# Patient Record
Sex: Female | Born: 1990 | Race: White | Hispanic: No | Marital: Single | Smoking: Current every day smoker
Health system: Southern US, Community
[De-identification: ages and names within clinical notes are randomized; demographics above are authoritative.]

## PROBLEM LIST (undated history)

## (undated) HISTORY — PX: TONSILLECTOMY: SUR1361

## (undated) HISTORY — PX: HAND SURGERY: SHX662

---
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
## 2011-02-07 NOTE — Op Note (Signed)
NAMEMINDEL, FRISCIA                        ACCOUNT NO.:  [id number]   MEDICAL RECORD NO.:  [id number]                   PATIENT TYPE:  AMB   LOCATION:  DSC                                  FACILITY:  [facility]   PHYSICIAN:  Kinnie Scales. Annalee Genta, M.D.            DATE OF BIRTH:  Mar 28, 1991   DATE OF PROCEDURE:  03/18/2004  DATE OF DISCHARGE:                                 OPERATIVE REPORT   PREOPERATIVE DIAGNOSES:  1. Depressed nasal fracture.  2. Nasal trauma.   POSTOPERATIVE DIAGNOSES:  1. Depressed nasal fracture.  2. Nasal trauma.   OPERATION PERFORMED:  Closed reduction nasal fracture.   SURGEON:  Kinnie Scales. Annalee Genta, M.D.   ANESTHESIA:  General.   COMPLICATIONS:  None.   ESTIMATED BLOOD LOSS:  Minimal.   DISPOSITION:  Patient transferred from the operating room to the recovery  room in stable condition.   INDICATIONS FOR PROCEDURE:  The patient is a nearly 20 year old white female  who was referred for evaluation of a depressed nasal fracture suffered while  playing soft ball.  The patient had no significant prior injuries and no  nasal complaints.  Examination in the office one week after injury showed a  depressed left nasal fracture and right nasal dorsal deviation.  Nasal  septum was mildly deviated.  No evidence of septal hematoma.  Given the  patient's history and examination, I recommended closed reduction of the  nasal fracture under general anesthesia.  The risks, benefits, and possible  complications of the procedure were discussed in detail with the patient and  her grandmother, who understood and concurred with our plan for surgery  which was scheduled as above.   DESCRIPTION OF PROCEDURE:  The patient was brought to the operating room on  March 18, 2004 and placed in supine position on the operating table.  She was  injected with 1 mL of 1% lidocaine with 1:100,000 dilution of epinephrine  injected in the fracture site via a transnasal and transcutaneous  injection.  The patient's nose was then packed with Afrin soaked cottonoid pledgets  which were left in place for approximately 10 minutes to allow for  vasoconstriction and hemostasis.  After allowing adequate time for  vasoconstriction, the patient was prepped and draped in sterile fashion.  The surgical procedure  was begun with nasal evaluation.  She had a  depressed left nasal fracture.  Using a Goldman elevator within the nasal  cavity and digital pressure, the nasal fracture was reduced and the nasal  dorsum was brought to the midline.  An external nasal splint was then placed  which consisted of benzoin  solution followed by 1/4 inch paper tape and an Aquaplast thermal plastic  nasal dorsal splint.  The patient was then awakened from her anesthetic.  She was extubated and was transferred from the operating room to the  recovery room in stable condition.  There were no complications or blood  loss.                                               Kinnie Scales. Annalee Genta, M.D.    DLS/MEDQ  D:  04/54/0981  T:  03/18/2004  Job:  [id number]

## 2011-12-14 ENCOUNTER — Encounter (HOSPITAL_COMMUNITY): Payer: Self-pay | Admitting: *Deleted

## 2011-12-14 ENCOUNTER — Other Ambulatory Visit: Payer: Self-pay

## 2011-12-14 ENCOUNTER — Emergency Department (HOSPITAL_COMMUNITY)
Admission: EM | Admit: 2011-12-14 | Discharge: 2011-12-15 | Disposition: A | Discharge status: Home / Self Care | Payer: Self-pay | Attending: Emergency Medicine | Admitting: Emergency Medicine

## 2011-12-14 DIAGNOSIS — T450X4A Poisoning by antiallergic and antiemetic drugs, undetermined, initial encounter: Secondary | ICD-10-CM | POA: Insufficient documentation

## 2011-12-14 DIAGNOSIS — F3289 Other specified depressive episodes: Secondary | ICD-10-CM | POA: Insufficient documentation

## 2011-12-14 DIAGNOSIS — T50992A Poisoning by other drugs, medicaments and biological substances, intentional self-harm, initial encounter: Secondary | ICD-10-CM | POA: Insufficient documentation

## 2011-12-14 DIAGNOSIS — F329 Major depressive disorder, single episode, unspecified: Secondary | ICD-10-CM | POA: Insufficient documentation

## 2011-12-14 DIAGNOSIS — T50902A Poisoning by unspecified drugs, medicaments and biological substances, intentional self-harm, initial encounter: Secondary | ICD-10-CM

## 2011-12-14 LAB — CBC
Hemoglobin: 12.7 g/dL (ref 12.0–15.0)
MCH: 31.4 pg (ref 26.0–34.0)
MCV: 92.6 fL (ref 78.0–100.0)
RDW: 13 % (ref 11.5–15.5)
WBC: 9.6 10*3/uL (ref 4.0–10.5)

## 2011-12-14 LAB — POCT I-STAT, CHEM 8
BUN: 9 mg/dL (ref 6–23)
Chloride: 105 mEq/L (ref 96–112)
Potassium: 3.5 mEq/L (ref 3.5–5.1)
Sodium: 143 mEq/L (ref 135–145)

## 2011-12-14 LAB — DIFFERENTIAL
Basophils Absolute: 0 10*3/uL (ref 0.0–0.1)
Lymphocytes Relative: 25 % (ref 12–46)
Monocytes Absolute: 0.5 10*3/uL (ref 0.1–1.0)

## 2011-12-14 LAB — ACETAMINOPHEN LEVEL: Acetaminophen (Tylenol), Serum: <15 ug/mL (ref 10–30)

## 2011-12-14 LAB — ETHANOL: Alcohol, Ethyl (B): <11 mg/dL (ref 0–11)

## 2011-12-14 LAB — RAPID URINE DRUG SCREEN, HOSP PERFORMED
Barbiturates: NOT DETECTED
Cocaine: NOT DETECTED

## 2011-12-14 NOTE — ED Notes (Signed)
Pt placed in blue scrubs per protocol. [facility] notified sitter will be required at bedside.

## 2011-12-14 NOTE — ED Notes (Signed)
Pt speaking with ACT team.  
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
## 2011-12-14 NOTE — ED Provider Notes (Signed)
I saw and evaluated the patient, reviewed the resident's note and I agree with the findings and plan. Pt with overdose of benadryl.  No symptoms after 3 hours of observation.  Will continue to observe, ACT consult  Rolan Bucco, MD 12/14/11 2325

## 2011-12-14 NOTE — ED Notes (Signed)
EKG completed at 2139. NO old ekg.

## 2011-12-14 NOTE — ED Notes (Signed)
Pt arrives via ambulance after taking app 41 tablets of 25 mg diphenhydramine at app 2015 this evening. Pt reports self inducing vomiting app 15 minutes after ingesting medication
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
## 2011-12-14 NOTE — ED Provider Notes (Signed)
History     CSN: [id number]  Arrival date & time 12/14/11  2130   First MD Initiated Contact with Patient 12/14/11 2137      Chief Complaint  Patient presents with  . Drug Overdose    pt took 41 tabs of 25mg  diphenhydramine at 2015 tonight    (Consider location/radiation/quality/duration/timing/severity/associated sxs/prior treatment) HPI Pt took forty-one benadryl 25mg  pills in OD attempt around 2015.  She states that 15 mins after she took them, she had remorse and did not want to die so she forced herself to throw up pills.  States she saw many full form pills in emesis.  Denies taking any other meds.  Has h/o depression and has been stressed recently.  Denies SI/HI and AVH at this time.  Denies any symptoms.  Her depression and suicidal thoughts earlier today are described as moderate, worse with stress.    History reviewed. No pertinent past medical history.  Past Surgical History  Procedure Date  . Tonsillectomy     History reviewed. No pertinent family history.  History  Substance Use Topics  . Smoking status: Former Smoker    Quit date: 11/30/2011  . Smokeless tobacco: Not on file  . Alcohol Use: Yes     socially    OB History    Grav Para Term Preterm Abortions TAB SAB Ect Mult Living                  Review of Systems  Constitutional: Negative for fatigue.  Respiratory: Negative for shortness of breath.   Cardiovascular: Negative for chest pain.  Gastrointestinal: Negative for nausea and abdominal pain.  Neurological: Negative for light-headedness.  Psychiatric/Behavioral: Positive for self-injury (attempted OD, remorse after, forced emesis). Negative for suicidal ideas.  All other systems reviewed and are negative.    Allergies  Niacin and related  Home Medications  No current outpatient prescriptions on file.  BP 100/52  Pulse 71  Temp(Src) 97 F (36.1 C) (Oral)  Resp 18  Ht 5\' 8"  (1.727 m)  Wt 170 lb (77.111 kg)  BMI 25.85 kg/m2  SpO2  98%  LMP 12/11/2011  Physical Exam  Nursing note and vitals reviewed. Constitutional: She appears well-developed and well-nourished.  HENT:  Head: Normocephalic and atraumatic.  Eyes: Right eye exhibits no discharge. Left eye exhibits no discharge.  Neck: Normal range of motion. Neck supple.  Cardiovascular: Normal rate, regular rhythm and normal heart sounds.   Pulmonary/Chest: Effort normal and breath sounds normal.  Abdominal: Soft. There is no tenderness.  Musculoskeletal: She exhibits no edema and no tenderness.  Neurological: She is alert. GCS eye subscore is 4. GCS verbal subscore is 5. GCS motor subscore is 6.  Skin: Skin is warm and dry.  Psychiatric: She has a normal mood and affect. Her behavior is normal.    ED Course  Procedures (including critical care time)  Labs Reviewed  URINE RAPID DRUG SCREEN (HOSP PERFORMED) - Abnormal; Notable for the following:    Benzodiazepines POSITIVE (*)    Tetrahydrocannabinol POSITIVE (*)    All other components within normal limits  SALICYLATE LEVEL - Abnormal; Notable for the following:    Salicylate Lvl <2.0 (*)    All other components within normal limits  CBC  DIFFERENTIAL  ETHANOL  ACETAMINOPHEN LEVEL  POCT I-STAT, CHEM 8  POCT PREGNANCY, URINE   No results found.   1. Suicidal behavior   2. Intentional drug overdose      EKG: sinus arrhythmia, no qrs  widening or qtc prolongation MDM  Pt is in nad, afvss, nontoxic appearing, exam and hx as above.  Psych panel ordered.  Exam nl and pt acting nl, no mydriasis, no slurred speech, no increased sleepiness.  ekg nl.    Recheck about one hour after pt arrived, still acting nl, no grogginess, no mydriasis.  12:40 AM Care transferred to Dr Manus Gunning, pt stable and still asx       Elijio Miles, MD 12/15/11 0040

## 2011-12-15 NOTE — ED Notes (Signed)
Pt speaking with psychiatrist via telepsych.

## 2011-12-15 NOTE — ED Notes (Signed)
EDP (dr guess) at bedside to discuss plan of care with pt.

## 2011-12-15 NOTE — ED Notes (Signed)
Faxed report given to dr rancour. MD at bedside to speak with pt

## 2011-12-15 NOTE — ED Notes (Signed)
Spoke with Almira Coaster from poison control regarding pt. Pt with sitter at bedside awaiting telepsych. Pt using phone per her request.

## 2011-12-15 NOTE — ED Notes (Signed)
Pt in room 27. Awaiting call back from psychiatrist for telepsych.

## 2011-12-15 NOTE — ED Provider Notes (Signed)
Tele psychiatry consult complete. Patient expresses regrets for her overdose attempt. She is not have any delusional thought process or cutting impairment. She is cleared for discharge. She denies any suicidal or homicidal ideation. He is awake and alert in no distress. She denies any pain complaints. She does not demonstrate any anticholinergic side effects  BP 100/52  Pulse 71  Temp(Src) 97 F (36.1 C) (Oral)  Resp 18  Ht 5\' 8"  (1.727 m)  Wt 170 lb (77.111 kg)  BMI 25.85 kg/m2  SpO2 98%  LMP 12/11/2011   Glynn Octave, MD 12/15/11 [phone]

## 2011-12-15 NOTE — ED Provider Notes (Signed)
I saw and evaluated the patient, reviewed the resident's note and I agree with the findings and plan.   Jacquees Gongora, MD 12/15/11 0052 

## 2011-12-15 NOTE — Discharge Instructions (Signed)
No-harm Safety Contract  A no-harm safety contract is a written or verbal agreement between you and a mental health professional to promote safety. It contains specific actions and promises you agree to. The agreement also includes instructions from the therapist or doctor. The instructions will help prevent you from harming yourself or harming others. Harm can be as mild as pinching yourself, but can increase in intensity to actions like burning or cutting yourself. The extreme level of self-harm would be committing suicide. No-harm safety contracts are also sometimes referred to as a Charity fundraiser, suicide Financial controller, no-harm agreements or decisions, or a Engineer, manufacturing systems.  REASONS FOR NO-HARM SAFETY CONTRACTS Safety contracts are just one part of an overall treatment plan to help keep you safe and free of harm. A safety contract may help to relieve anxiety, restore a sense of control, state clearly the alternatives to harm or suicide, and give you and your therapist or doctor a gauge for how you are doing in between visits. Many factors impact the decision to use a no-harm safety contract and its effectiveness. A proper overall treatment plan and evaluation and good patient understanding are the keys to good outcomes. CONTRACT ELEMENTS  A contract can range from simple to complex. They include all or some of the following:  Action statements. These are statements you agree to do or not do. Example: If I feel my life is becoming too difficult, I agree to do the following so there is no harm to myself or others:  Talk with family or friends.   Rid myself of all things that I could use to harm myself.   Do an activity I enjoy or have enjoyed in the recent past.  Coping strategies. These are ways to think and feel that decrease stress, such as:  Use of affirmations or positive statements about self.   Good self-care, including improved grooming, and healthy eating, and healthy  sleeping patterns.   Increase physical exercise.   Increase social involvement.   Focus on positive aspects of life.  Crisis management. This would include what to do if there was trouble following the contract or an urge to harm. This might include notifying family or your therapist of suicidal thoughts. Be open and honest about suicidal urges. To prevent a crisis, do the following:  List reasons to reach out for support.   Keep contact numbers and available hours handy.  Treatment goals. These are goals would include no suicidal thoughts, improved mood, and feelings of hopefulness. Listed responsibilities of different people involved in care. This could include family members. A family member may agree to remove firearms or other lethal weapons/substances from your ease of access. A timeline. A timeline can be in place from one therapy session to the next session. HOME CARE INSTRUCTIONS   Follow your no-harm safety contract.   Contact your therapist and/or doctor if you have any questions or concerns.  MAKE SURE YOU:   Understand these instructions.   Will watch your condition. Noticing any mood changes or suicidal urges.   Will get help right away if you are not doing well or get worse.  Document Released: 02/26/2010 Document Revised: 08/28/2011 Document Reviewed: 02/26/2010 [facility] Patient Information 2012 [place], [place].Overdose, Adult A person can overdose on alcohol, drugs or both by accident or on purpose. If it was on purpose, it is a serious matter. Professional help should be sought. If the overdose was an accident, certain steps should be taken to make sure that  it never happens again. ACCIDENTAL OVERDOSE Overdosing on prescription medications can be a result of:  Not understanding the instructions.   Misreading the label.   Forgetting that you took a dose and then taking another by mistake. This situation happens a lot.  To make sure this does not happen  again:  Clarify the correct dosage with your caregiver.   Place the correct dosage in a "pill-minder" container (labeled for each day and time of day).   Have someone dispense your medicine.  Please be sure to follow-up with your primary care doctor as directed. INTENTIONAL OVERDOSE If the overdose was on purpose, it is a serious situation. Taking more than the prescribed amount of medications (including taking someone else's prescription), abusing street drugs or drinking an amount of alcohol that requires medical treatment can show a variety of possible problems. These may indicate you:  Are depressed or suicidal.   Are abusing drugs, took too much or combined different drugs to experiment with the effects.   Mixed alcohol with drugs and did not realize the danger of doing so (this is drug abuse).   Are suffering addiction to drugs and/or alcohol (also known as chemical dependency).   Binge drink.  If you have not been referred to a mental health professional for help, it is important that you get help right away. Only a professional can determine which problems may exist and what the best course of treatment may be. It is your responsibility to follow-up with further evaluation or treatment as directed.  Alcohol is responsible for a large number of overdoses and unintended deaths among college-age young adults. Binge drinking is consuming 4-5 drinks in a short period of time. The amount of alcohol in standard servings of wine (5 oz.), beer (12 oz.) and distilled spirits (1.5 oz., 80 proof) is the same. Beer or wine can be just as dangerous to the binge drinker as "hard" liquor can be.  CONSEQUENCES OF BINGE DRINKING Alcohol poisoning is the most serious consequence of binge drinking. This is a severe and potentially fatal physical reaction to an alcohol overdose. When too much alcohol is consumed, the brain does not get enough oxygen. The lack of oxygen will eventually cause the brain to  shut down the voluntary functions that regulate breathing and heart rate. Symptoms of alcohol poisoning include:  Vomiting.   Passing out (unconsciousness).   Cold, clammy, pale or bluish skin.   Slow or irregular breathing.  WHAT SHOULD I DO NEXT? If you have a history of drug abuse or suffer chemical dependency (alcoholism, drug addiction or both), you might consider the following:  Talk with a qualified substance abuse counselor and consider entering a treatment program.   Go to a detox facility if necessary.   If you were attending self-help group meetings, consider returning to them and go often.   Explore other resources located near you (see sources listed below).  If you are unsure if you have a substance abuse problem, ask yourself the following questions:  Have you been told by friends or family that drugs/alcohol has become a problem?   Do you get into fights when drinking or using drugs?   Do you have blackouts (not remembering what you do while using)?   Do you lie about use or amounts of drugs or alcohol you consume?   Do you need chemicals to get you going?   Do you suffer in work or school performance because of drug or alcohol use?  Do you get sick from drug or alcohol use but continue to use anyway?   Do you need drugs or alcohol to relate to people or feel comfortable in social situations?   Do you use drugs or alcohol to forget problems?  If you answered "Yes" to any of the above questions, it means you show signs of chemical dependency and a professional evaluation is suggested. The longer the use of drugs and alcohol continues, the problems will become greater. SEEK IMMEDIATE MEDICAL CARE IF:   You feel like you might repeat your problematic behavior.   You need someone to talk to and feel that it should not wait.   You feel you are a danger to yourself or someone else.   You feel like you are having a new reaction to medications you are taking,  or you are getting worse after leaving a care center.   You have an overwhelming urge to drink or use drugs.  Addiction cannot be cured, but it can be treated successfully. Treatment centers are listed in the yellow pages under: Cocaine, Narcotics, and Alcoholics Anonymous. Most hospitals and clinics can refer you to a specialized care center. The [place] government maintains a toll-free number for obtaining treatment referrals: (934)258-7934 or 224-648-5575 (TDD) and maintains a website: http://findtreatment.RockToxic.pl. Other websites for additional information are: www.mentalhealth.RockToxic.pl. and GreatestFeeling.tn. In [place] treatment resources are listed in each [place] with listings available under [employer] for [employer] or similar titles. Document Released: 09/11/2003 Document Revised: 08/28/2011 Document Reviewed: 08/02/2008 [facility] Patient Information 2012 [place], [place].

## 2011-12-15 NOTE — ED Notes (Signed)
Per psychiatrist, pt is psychologically cleared for discharge. Pt notified there may be a delay prior to receiving discharge paperwork. Pt on telephone with friend at this time.

## 2013-02-27 ENCOUNTER — Encounter (HOSPITAL_COMMUNITY): Payer: Self-pay | Admitting: *Deleted

## 2013-02-27 ENCOUNTER — Emergency Department (HOSPITAL_COMMUNITY)
Admission: EM | Admit: 2013-02-27 | Discharge: 2013-02-27 | Disposition: A | Discharge status: Home / Self Care | Payer: Self-pay | Attending: Emergency Medicine | Admitting: Emergency Medicine

## 2013-02-27 DIAGNOSIS — Z202 Contact with and (suspected) exposure to infections with a predominantly sexual mode of transmission: Secondary | ICD-10-CM

## 2013-02-27 DIAGNOSIS — N898 Other specified noninflammatory disorders of vagina: Secondary | ICD-10-CM | POA: Insufficient documentation

## 2013-02-27 DIAGNOSIS — F17200 Nicotine dependence, unspecified, uncomplicated: Secondary | ICD-10-CM | POA: Insufficient documentation

## 2013-02-27 DIAGNOSIS — Z3202 Encounter for pregnancy test, result negative: Secondary | ICD-10-CM | POA: Insufficient documentation

## 2013-02-27 LAB — URINALYSIS, ROUTINE W REFLEX MICROSCOPIC
Glucose, UA: NEGATIVE mg/dL
Hgb urine dipstick: NEGATIVE
Leukocytes, UA: NEGATIVE
Nitrite: NEGATIVE
Protein, ur: NEGATIVE mg/dL
Urobilinogen, UA: 0.2 mg/dL (ref 0.0–1.0)
pH: 7.5 (ref 5.0–8.0)

## 2013-02-27 LAB — WET PREP, GENITAL

## 2013-02-27 MED ORDER — ONDANSETRON HCL 4 MG PO TABS
4.0000 mg | ORAL_TABLET | Freq: Once | ORAL | Status: AC
  Administered 2013-02-27: 4 mg via ORAL
  Filled 2013-02-27: qty 1

## 2013-02-27 MED ORDER — CEFTRIAXONE SODIUM 250 MG IJ SOLR
250.0000 mg | Freq: Once | INTRAMUSCULAR | Status: AC
  Administered 2013-02-27: 250 mg via INTRAMUSCULAR
  Filled 2013-02-27: qty 250

## 2013-02-27 MED ORDER — CEFTRIAXONE SODIUM 1 G IJ SOLR: 1.0000 g | Freq: Once | INTRAMUSCULAR | Status: DC

## 2013-02-27 MED ORDER — PENICILLIN G BENZATHINE 1200000 UNIT/2ML IM SUSP
1.2000 10*6.[IU] | Freq: Once | INTRAMUSCULAR | Status: AC
  Administered 2013-02-27: 1.2 10*6.[IU] via INTRAMUSCULAR
  Filled 2013-02-27: qty 2

## 2013-02-27 MED ORDER — AZITHROMYCIN 250 MG PO TABS
1000.0000 mg | ORAL_TABLET | Freq: Once | ORAL | Status: AC
  Administered 2013-02-27: 1000 mg via ORAL
  Filled 2013-02-27: qty 4

## 2013-02-27 NOTE — ED Notes (Signed)
Pt alert & oriented x4, stable gait. Patient given discharge instructions, paperwork & prescription(s). Patient  instructed to stop at the registration desk to finish any additional paperwork. Patient verbalized understanding. Pt left department w/ no further questions. 

## 2013-02-27 NOTE — ED Provider Notes (Signed)
History     CSN: [id number]  Arrival date & time 02/27/13  1940   First MD Initiated Contact with Patient 02/27/13 2002      Chief Complaint  Patient presents with  . s74.5     (Consider location/radiation/quality/duration/timing/severity/associated sxs/prior treatment) HPI Comments: The patient states that proximal a 3-4 weeks ago she was involved in a sexual intercourse. She found out approximately 2 days ago that this individual had syphilis, and she should be checked out. She states that in the last 3-4 days she's been noticing increased odor of the vaginal area and increase discharge. She presents to the emergency department now for evaluation of these symptoms.  The history is provided by the patient.    History reviewed. No pertinent past medical history.  Past Surgical History  Procedure Laterality Date  . Tonsillectomy      History reviewed. No pertinent family history.  History  Substance Use Topics  . Smoking status: Current Every Day Smoker    Last Attempt to Quit: 11/30/2011  . Smokeless tobacco: Not on file  . Alcohol Use: Yes     Comment: socially    OB History   Grav Para Term Preterm Abortions TAB SAB Ect Mult Living                  Review of Systems  Constitutional: Negative for activity change.       All ROS Neg except as noted in HPI  HENT: Negative for nosebleeds and neck pain.   Eyes: Negative for photophobia and discharge.  Respiratory: Negative for cough, shortness of breath and wheezing.   Cardiovascular: Negative for chest pain and palpitations.  Gastrointestinal: Negative for abdominal pain and blood in stool.  Genitourinary: Positive for vaginal discharge. Negative for dysuria, frequency, hematuria, flank pain and difficulty urinating.  Musculoskeletal: Negative for back pain and arthralgias.  Skin: Negative.   Neurological: Negative for dizziness, seizures and speech difficulty.  Psychiatric/Behavioral: Negative for hallucinations  and confusion.    Allergies  Niacin and related  Home Medications  No current outpatient prescriptions on file.  BP 118/74  Pulse 84  Temp(Src) 97.4 F (36.3 C) (Oral)  Resp 24  Ht 5\' 8"  (1.727 m)  Wt 157 lb (71.215 kg)  BMI 23.88 kg/m2  SpO2 100%  LMP 02/06/2013  Physical Exam  Nursing note and vitals reviewed. Constitutional: She is oriented to person, place, and time. She appears well-developed and well-nourished.  Non-toxic appearance.  HENT:  Head: Normocephalic.  Right Ear: Tympanic membrane and external ear normal.  Left Ear: Tympanic membrane and external ear normal.  Eyes: EOM and lids are normal. Pupils are equal, round, and reactive to light.  Neck: Normal range of motion. Neck supple. Carotid bruit is not present.  Cardiovascular: Normal rate, regular rhythm, normal heart sounds, intact distal pulses and normal pulses.   Pulmonary/Chest: Breath sounds normal. No respiratory distress.  No cvat.  Abdominal: Soft. Bowel sounds are normal. There is no tenderness. There is no guarding.  Genitourinary:    There is no rash on the right labia. There is no rash on the left labia. No bleeding around the vagina. No foreign body around the vagina. Vaginal discharge found.  Chaperone present during examination  Musculoskeletal: Normal range of motion.  Lymphadenopathy:       Head (right side): No submandibular adenopathy present.       Head (left side): No submandibular adenopathy present.    She has no cervical adenopathy.  Neurological: She is alert and oriented to person, place, and time. She has normal strength. No cranial nerve deficit or sensory deficit.  Skin: Skin is warm and dry.  Psychiatric: She has a normal mood and affect. Her speech is normal.    ED Course  Procedures (including critical care time)  Labs Reviewed  URINALYSIS, ROUTINE W REFLEX MICROSCOPIC - Abnormal; Notable for the following:    APPearance CLOUDY (*)    All other components within  normal limits  GC/CHLAMYDIA PROBE AMP  PREGNANCY, URINE  RPR   No results found.   No diagnosis found.    MDM  Pt examined. Previous records completed. Vital signs reviewed.   Wet prep reveals a few clue cells. UA reveals cloudy spec. O/w wnl.  Vital signs stable. Advised pt of test results. Pt treated in ED for possible STD. [facility]      Kathie Dike, PA-C 02/28/13 [phone]

## 2013-02-27 NOTE — ED Notes (Signed)
Pt had sex with a guy 2 wks ago. He just called her 2 days ago and told her he had syphilis and she needed to get checked. Pt c/o dysuria and vaginal odor.

## 2013-02-27 NOTE — ED Notes (Signed)
Pt had no adverse reactions to injections

## 2013-03-01 NOTE — ED Provider Notes (Signed)
Medical screening examination/treatment/procedure(s) were performed by non-physician practitioner and as supervising physician I was immediately available for consultation/collaboration.   Benny Lennert, MD 03/01/13 [phone]

## 2013-03-31 ENCOUNTER — Ambulatory Visit (INDEPENDENT_AMBULATORY_CARE_PROVIDER_SITE_OTHER): Payer: Self-pay | Admitting: Family Medicine

## 2013-03-31 ENCOUNTER — Encounter: Payer: Self-pay | Admitting: Family Medicine

## 2013-03-31 VITALS — BP 102/70 | Temp 98.3°F | Wt 175.4 lb

## 2013-03-31 DIAGNOSIS — J329 Chronic sinusitis, unspecified: Secondary | ICD-10-CM

## 2013-03-31 MED ORDER — AMOXICILLIN 500 MG PO TABS: ORAL_TABLET | ORAL | Status: AC

## 2013-03-31 NOTE — Progress Notes (Signed)
  Subjective:    Patient ID: Erin Ritter, female    DOB: 06-Mar-1991, 22 y.o.   MRN: [id number]  HPI Patient arrives office with headache. Frontal in nature. Congestion intermittently. Sore throat off-and-on. Low-grade fever. Minimal cough. No vomiting no diarrhea ROS otherwise negative.   Review of Systems Otherwise negative    Objective:   Physical Exam  Alert moderate nasal congestion. Frontal tenderness. Pharynx erythematous. Neck supple. Lungs clear. Heart regular in rhythm.      Assessment & Plan:  Impression sinusitis with secondary pharyngitis. Plan antibiotics prescribed. Symptomatic care discussed. WSL

## 2015-10-01 ENCOUNTER — Emergency Department (HOSPITAL_COMMUNITY)
Admission: EM | Admit: 2015-10-01 | Discharge: 2015-10-01 | Disposition: A | Discharge status: Home / Self Care | Payer: Self-pay | Attending: Emergency Medicine | Admitting: Emergency Medicine

## 2015-10-01 ENCOUNTER — Encounter (HOSPITAL_COMMUNITY): Payer: Self-pay | Admitting: Emergency Medicine

## 2015-10-01 DIAGNOSIS — F17200 Nicotine dependence, unspecified, uncomplicated: Secondary | ICD-10-CM | POA: Insufficient documentation

## 2015-10-01 DIAGNOSIS — Z207 Contact with and (suspected) exposure to pediculosis, acariasis and other infestations: Secondary | ICD-10-CM

## 2015-10-01 DIAGNOSIS — Z2089 Contact with and (suspected) exposure to other communicable diseases: Secondary | ICD-10-CM | POA: Insufficient documentation

## 2015-10-01 MED ORDER — PERMETHRIN 5 % EX CREA: TOPICAL_CREAM | CUTANEOUS | Status: AC

## 2015-10-01 MED ORDER — TRIAMCINOLONE ACETONIDE 0.5 % EX OINT: 1.0000 "application " | TOPICAL_OINTMENT | Freq: Two times a day (BID) | CUTANEOUS | Status: AC

## 2015-10-01 MED ORDER — DEXAMETHASONE 4 MG PO TABS: 4.0000 mg | ORAL_TABLET | Freq: Two times a day (BID) | ORAL | Status: AC

## 2015-10-01 NOTE — ED Provider Notes (Signed)
CSN: [id number]     Arrival date & time 10/01/15  7829 History   First MD Initiated Contact with Patient 10/01/15 [phone]     Chief Complaint  Patient presents with  . Insect Bite     (Consider location/radiation/quality/duration/timing/severity/associated sxs/prior Treatment) HPI Comments: Pt is a 25 y/o female who presents to the ED with a new rash. Pt states she was recently released from prison. She states she was recently notified by her bunkmate of having "scabies". Pt states she had noted bumps and rash, but thought is was the difference in soap or other products. She feels the problem is getting worse and request evaluation. No fever or chill reported.  The history is provided by the patient.    History reviewed. No pertinent past medical history. Past Surgical History  Procedure Laterality Date  . Tonsillectomy    . Hand surgery     History reviewed. No pertinent family history. Social History  Substance Use Topics  . Smoking status: Current Every Day Smoker    Last Attempt to Quit: 11/30/2011  . Smokeless tobacco: None  . Alcohol Use: Yes     Comment: socially   OB History    No data available     Review of Systems  Skin: Positive for rash.  All other systems reviewed and are negative.     Allergies  Niacin and related  Home Medications   Prior to Admission medications   Not on File   BP 125/75 mmHg  Pulse 81  Temp(Src) 97.8 F (36.6 C) (Oral)  Resp 16  Ht 5\' 8"  (1.727 m)  Wt 92.987 kg  BMI 31.18 kg/m2  SpO2 100%  LMP 09/05/2015 (Exact Date) Physical Exam  Constitutional: She is oriented to person, place, and time. She appears well-developed and well-nourished.  Non-toxic appearance.  HENT:  Head: Normocephalic.  Right Ear: Tympanic membrane and external ear normal.  Left Ear: Tympanic membrane and external ear normal.  Eyes: EOM and lids are normal. Pupils are equal, round, and reactive to light.  Neck: Normal range of motion. Neck supple.  Carotid bruit is not present.  Cardiovascular: Normal rate, regular rhythm, normal heart sounds, intact distal pulses and normal pulses.   Pulmonary/Chest: Breath sounds normal. No respiratory distress.  Abdominal: Soft. Bowel sounds are normal. There is no tenderness. There is no guarding.  Musculoskeletal: Normal range of motion.  Lymphadenopathy:       Head (right side): No submandibular adenopathy present.       Head (left side): No submandibular adenopathy present.    She has no cervical adenopathy.  Neurological: She is alert and oriented to person, place, and time. She has normal strength. No cranial nerve deficit or sensory deficit.  Skin: Skin is warm and dry.  There is a red papular rash on the arms, back, and the elastic band area of the waist, and on the abdomen. No red streaks noted. No drainage noted. No rash in the web space of the hands.  Psychiatric: She has a normal mood and affect. Her speech is normal.  Nursing note and vitals reviewed.   ED Course  Procedures (including critical care time) Labs Review Labs Reviewed - No data to display  Imaging Review No results found. I have personally reviewed and evaluated these images and lab results as part of my medical decision-making.   EKG Interpretation None      MDM Vital signs stable. No open wounds. No red streaking or signs of cellulitis Pt  reports exposure to scabies. Plan: Elimite, triamcinolone, Decadron.    Final diagnoses:  None    *I have reviewed nursing notes, vital signs, and all appropriate lab and imaging results for this patient.**    Ivery QualeHobson Ellasyn Swilling, PA-C 10/01/15 [phone]  Blane OharaJoshua Zavitz, MD 10/02/15 [phone]

## 2015-10-01 NOTE — ED Notes (Signed)
Pt reports bumps all over body, pt bunkmate had scabies and told pt to come to be seen for this.
